# Patient Record
Sex: Male | Born: 1945 | Race: White | Hispanic: No | Marital: Married | Smoking: Former smoker
Health system: Southern US, Community
[De-identification: ages and names within clinical notes are randomized; demographics above are authoritative.]

## PROBLEM LIST (undated history)

## (undated) DIAGNOSIS — I10 Essential (primary) hypertension: Secondary | ICD-10-CM

## (undated) DIAGNOSIS — Z77098 Contact with and (suspected) exposure to other hazardous, chiefly nonmedicinal, chemicals: Secondary | ICD-10-CM

## (undated) DIAGNOSIS — E785 Hyperlipidemia, unspecified: Secondary | ICD-10-CM

## (undated) HISTORY — DX: Contact with and (suspected) exposure to other hazardous, chiefly nonmedicinal, chemicals: Z77.098

## (undated) HISTORY — DX: Essential (primary) hypertension: I10

## (undated) HISTORY — DX: Hyperlipidemia, unspecified: E78.5

---

## 2000-09-12 HISTORY — PX: CHOLECYSTECTOMY: SHX55

## 2001-04-16 ENCOUNTER — Observation Stay (HOSPITAL_COMMUNITY): Admission: RE | Admit: 2001-04-16 | Discharge: 2001-04-17 | Payer: Self-pay | Admitting: Surgery

## 2001-04-16 ENCOUNTER — Encounter: Payer: Self-pay | Admitting: Surgery

## 2001-04-16 ENCOUNTER — Encounter (INDEPENDENT_AMBULATORY_CARE_PROVIDER_SITE_OTHER): Payer: Self-pay | Admitting: Specialist

## 2010-08-23 ENCOUNTER — Ambulatory Visit (HOSPITAL_COMMUNITY)
Admission: RE | Admit: 2010-08-23 | Discharge: 2010-08-23 | Payer: Self-pay | Source: Home / Self Care | Attending: Surgery | Admitting: Surgery

## 2010-11-23 LAB — CBC
HCT: 50.6 % (ref 39.0–52.0)
Hemoglobin: 17 g/dL (ref 13.0–17.0)
MCH: 29.9 pg (ref 26.0–34.0)
MCHC: 33.6 g/dL (ref 30.0–36.0)
MCV: 88.9 fL (ref 78.0–100.0)
Platelets: 260 10*3/uL (ref 150–400)
RBC: 5.69 MIL/uL (ref 4.22–5.81)
RDW: 15.1 % (ref 11.5–15.5)
WBC: 9.4 10*3/uL (ref 4.0–10.5)

## 2010-11-23 LAB — URINE MICROSCOPIC-ADD ON

## 2010-11-23 LAB — DIFFERENTIAL
Basophils Absolute: 0 10*3/uL (ref 0.0–0.1)
Basophils Relative: 0 % (ref 0–1)
Eosinophils Absolute: 0.2 10*3/uL (ref 0.0–0.7)
Eosinophils Relative: 2 % (ref 0–5)
Lymphocytes Relative: 30 % (ref 12–46)
Lymphs Abs: 2.8 10*3/uL (ref 0.7–4.0)
Monocytes Absolute: 0.7 10*3/uL (ref 0.1–1.0)
Monocytes Relative: 7 % (ref 3–12)
Neutro Abs: 5.8 10*3/uL (ref 1.7–7.7)
Neutrophils Relative %: 61 % (ref 43–77)

## 2010-11-23 LAB — URINALYSIS, ROUTINE W REFLEX MICROSCOPIC
Bilirubin Urine: NEGATIVE
Glucose, UA: NEGATIVE mg/dL
Ketones, ur: NEGATIVE mg/dL
pH: 5 (ref 5.0–8.0)

## 2010-11-23 LAB — BASIC METABOLIC PANEL
BUN: 11 mg/dL (ref 6–23)
CO2: 28 mEq/L (ref 19–32)
Calcium: 9.4 mg/dL (ref 8.4–10.5)
Chloride: 105 mEq/L (ref 96–112)
Creatinine, Ser: 0.95 mg/dL (ref 0.4–1.5)
GFR calc Af Amer: >60 mL/min (ref >60)
GFR calc non Af Amer: >60 mL/min (ref >60)
Glucose, Bld: 92 mg/dL (ref 70–99)
Potassium: 4.8 mEq/L (ref 3.5–5.1)
Sodium: 139 mEq/L (ref 135–145)

## 2010-11-23 LAB — SURGICAL PCR SCREEN: MRSA, PCR: NEGATIVE

## 2010-11-23 LAB — PROTIME-INR
INR: 1.05 (ref 0.00–1.49)
Prothrombin Time: 13.9 seconds (ref 11.6–15.2)

## 2011-01-28 NOTE — Op Note (Signed)
[facility]  Patient:    Spencer Ingram, Spencer Ingram                        MRN: [id number] Proc. Date: 04/16/01 Adm. Date:  [id number] Attending:  Abigail Miyamoto A                           Operative Report  PREOPERATIVE DIAGNOSIS:  Symptomatic cholelithiasis.  POSTOPERATIVE DIAGNOSIS:  Symptomatic cholelithiasis.  PROCEDURE:  Laparoscopic cholecystectomy.  SURGEON:  Abigail Miyamoto, M.D.  ASSISTANT:  Gita Kudo, M.D.  ANESTHESIA:  General endotracheal anesthesia.  ESTIMATED BLOOD LOSS:  Minimal.  DESCRIPTION OF PROCEDURE:  The patient was brought to the operating room, identified as [name]. He was placed supine on the operating room table and then general anesthesia was induced. His abdomen was then prepped and draped in the usual sterile fashion. Using a #15 blade, a small vertical incision was made above the umbilicus. The incision was carried down through the fascia with the scalpel. The fascia was then identified and opened with a scalpel. The underlying mesh from a previous umbilical hernia repair was also opened. The Hasson port was then placed through the opening and insufflation of the abdomen was begun. Next, an 11 mm port was placed in the patients epigastrium and two 5 mm ports were placed in the patients right flank all under direct vision. The gallbladder was then identified and grasped and retracted above the liver bed. Dissection was then carried out at the base of the gallbladder. The cystic duct was dissected out, clipped three times proximally, once distally and transected with the scissors. The cystic artery was likewise identified, clipped twice proximally, once distally and transected as well. The gallbladder was slowly dissected free from the liver bed with the electrocautery. Once removed, the gallbladder was placed in an endosac and removed through the incision at the umbilicus. The abdomen was then copiously irrigated  with normal saline. Hemostasis did appear to be achieved. All ports were then removed under direct vision. The fascial defect along with the mesh was then closed with two figure-of-eight #0 Prolene sutures. All skin incisions were then anesthetized with 0.25% Marcaine and closed with 4-0 Vicryl subcuticular sutures. Steri-Strips, gauze, and tape were then applied. The patient tolerated the procedure well. All sponge, needle and instrument counts were correct at the end of the procedure. The patient was then extubated in the operating room and taken in stable condition to the recovery room. DD:  04/16/01 TD:  04/17/01 Job: [id number]

## 2011-10-31 IMAGING — CR DG CHEST 2V
2 series · 2 of 2 positions shown · non-contrast
Comparison: None.

CLINICAL DATA: Incisional hernia.  Preoperative chest radiograph.

CHEST - 2 VIEW

[view not recorded (1 of 2)]
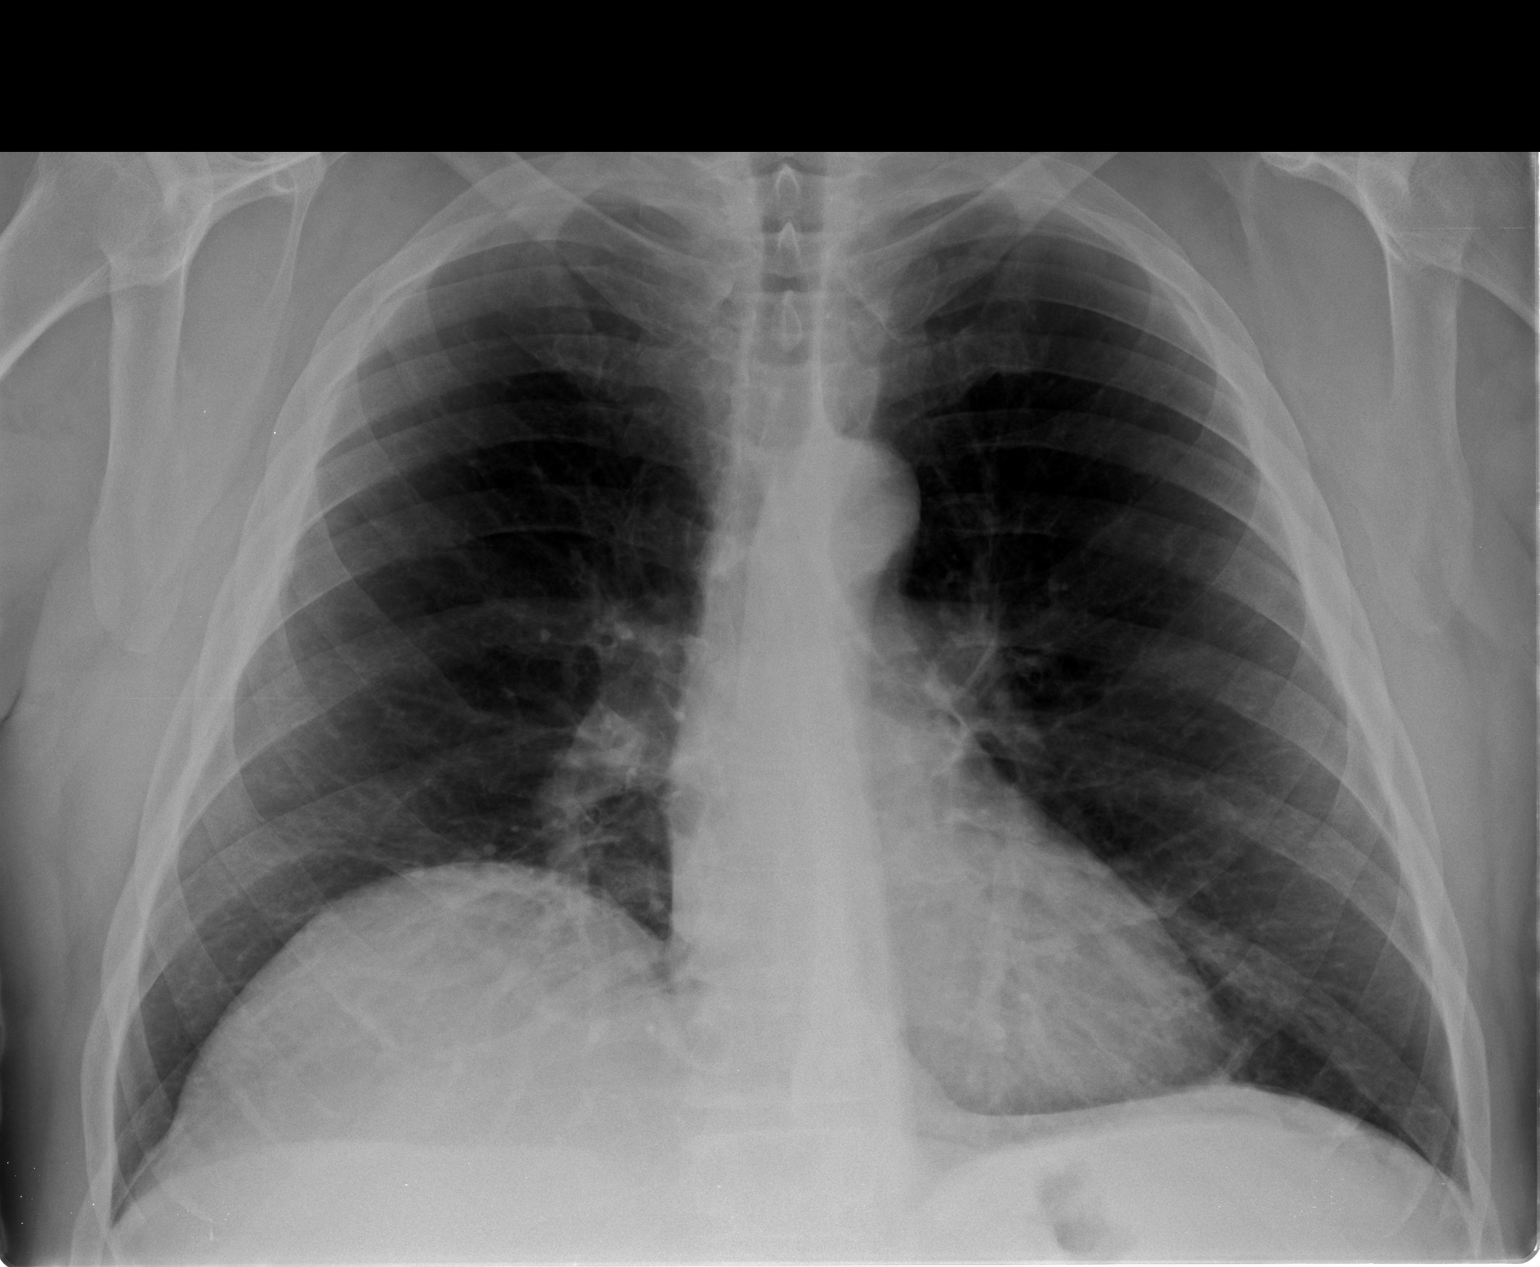

[view not recorded (2 of 2)]
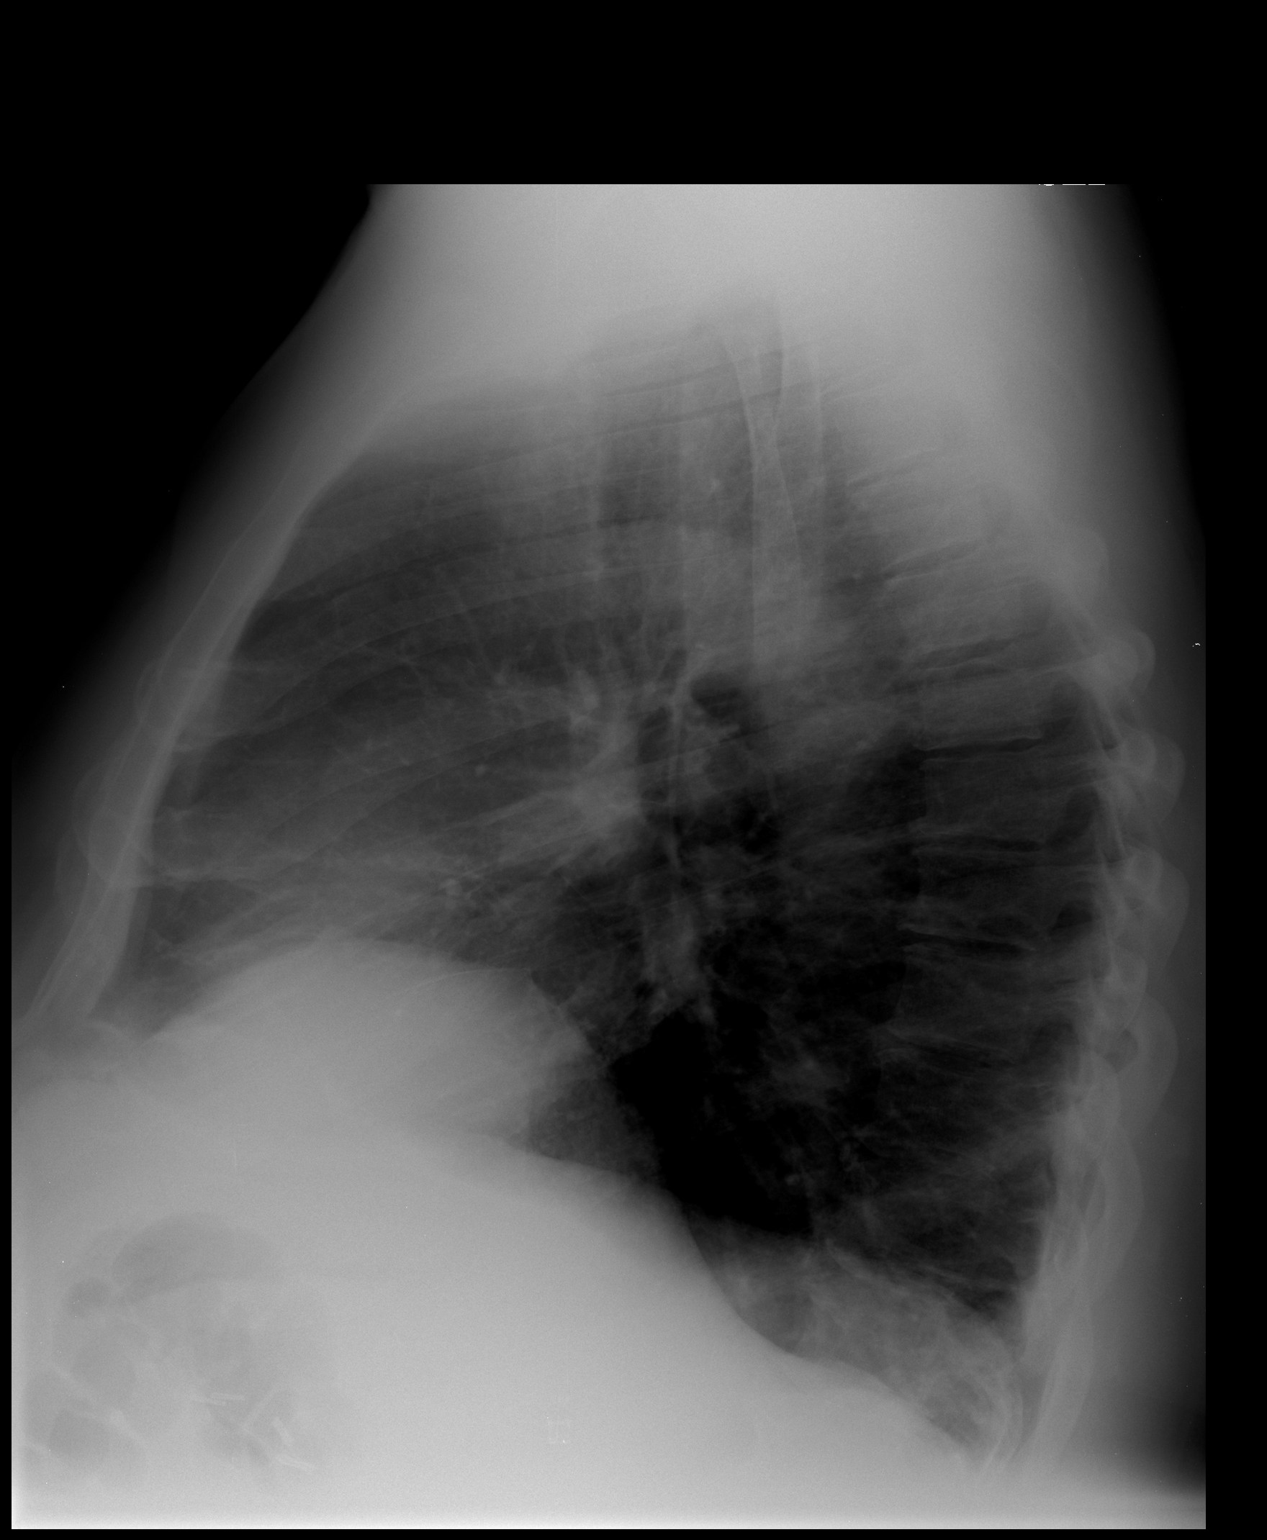

[2 of 2 positions shown; findings below may reference images not displayed]

FINDINGS: Eventration  of the right hemidiaphragm anteriorly.
Cardiopericardial silhouette appears within normal limits.  Trachea
midline.  No airspace disease.  No effusion.  No plain film
evidence of adenopathy.  Hyperinflation.
IMPRESSION: No active cardiopulmonary disease.  Hyperinflation suggesting
emphysema.

## 2013-08-29 ENCOUNTER — Ambulatory Visit (INDEPENDENT_AMBULATORY_CARE_PROVIDER_SITE_OTHER): Payer: Medicare Other | Admitting: Internal Medicine

## 2013-08-29 ENCOUNTER — Encounter: Payer: Self-pay | Admitting: Internal Medicine

## 2013-08-29 VITALS — BP 106/80 | HR 105 | Temp 98.4°F | Ht 69.0 in | Wt 229.0 lb

## 2013-08-29 DIAGNOSIS — F17200 Nicotine dependence, unspecified, uncomplicated: Secondary | ICD-10-CM

## 2013-08-29 DIAGNOSIS — J449 Chronic obstructive pulmonary disease, unspecified: Secondary | ICD-10-CM

## 2013-08-29 MED ORDER — BUDESONIDE-FORMOTEROL FUMARATE 160-4.5 MCG/ACT IN AERO: INHALATION_SPRAY | RESPIRATORY_TRACT | Status: DC

## 2013-08-29 MED ORDER — PANTOPRAZOLE SODIUM 40 MG PO TBEC: 40.0000 mg | DELAYED_RELEASE_TABLET | Freq: Every day | ORAL | Status: AC

## 2013-08-29 MED ORDER — ACLIDINIUM BROMIDE 400 MCG/ACT IN AEPB: 1.0000 | INHALATION_SPRAY | Freq: Two times a day (BID) | RESPIRATORY_TRACT | Status: DC

## 2013-08-29 MED ORDER — FAMOTIDINE 20 MG PO TABS: ORAL_TABLET | ORAL | Status: AC

## 2013-08-29 MED ORDER — PREDNISONE 10 MG PO TABS: ORAL_TABLET | ORAL | Status: DC

## 2013-08-29 NOTE — Patient Instructions (Addendum)
Stop advair and ipatropium   Plan A = Automatic=  Symbicort 160 Take 2 puffs first thing in am and then another 2 puffs about 12 hours later. Chase symbicort with tudorza  Plan B= backup = Albuterol 2 pffs every 4 hours, if you can't catch your breath  Plan C = Backup to plan B = use the nebulizer up to every 4 hours  Plan D = call the doctor  Plan E = ER   Prednisone 20 mg daily  x one week, 10 mg x one week, then 5 mg week - take with breakfast  Pantoprazole (protonix) 40 mg   Take 30-60 min before first meal of the day and Pepcid 20 mg one bedtime until return to office - this is the best way to tell whether stomach acid is contributing to your problem.    GERD (REFLUX)  is an extremely common cause of respiratory symptoms, many times with no significant heartburn at all.    It can be treated with medication, but also with lifestyle changes including avoidance of late meals, excessive alcohol, smoking cessation, and avoid fatty foods, chocolate, peppermint, colas, red wine, and acidic juices such as orange juice.  NO MINT OR MENTHOL PRODUCTS SO NO COUGH DROPS  USE SUGARLESS CANDY INSTEAD (jolley ranchers or Stover's)  NO OIL BASED VITAMINS - use powdered substitutes.    Please schedule a follow up office visit in 3 weeks, sooner if needed

## 2013-08-29 NOTE — Progress Notes (Signed)
   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    08/29/2013 1st [facility] office visit/ Kyria Bumgardner cc doe x "may a  Decade" best days can still entire [employer] leaning on cart s 02  But still needing neb esp in am on best days and on day of ov sob x 50 ft, better on prednisone but "I never get a high enough dose",  Assoc with min cough mostly in am's of scant white mucus and assoc with mod nasal congestion also,  No obvious patterns in day to day or daytime variabilty or assoc  cp or chest tightness, subjective wheeze overt sinus or hb symptoms. No unusual exp hx or h/o childhood pna/ asthma or knowledge of premature birth.  Sleeping ok without nocturnal  or early am exacerbation  of respiratory  c/o's or need for noct saba. Also denies any obvious fluctuation of symptoms with weather or environmental changes or other aggravating or alleviating factors except as outlined above   Current Medications, Allergies, Complete Past Medical History, Past Surgical History, Family History, and Social History were reviewed in [employer] record.  ROS  The following are not active complaints unless bolded sore throat, dysphagia, dental problems, itching, sneezing,  nasal congestion or excess/ purulent secretions, ear ache,   fever, chills, sweats, unintended wt loss, pleuritic or exertional cp, hemoptysis,  orthopnea pnd or leg swelling, presyncope, palpitations, heartburn, abdominal pain, anorexia, nausea, vomiting, diarrhea  or change in bowel or urinary habits, change in stools or urine, dysuria,hematuria,  rash, arthralgias, visual complaints, headache, numbness weakness or ataxia or problems with walking or coordination,  change in mood/affect or memory.             Review of Systems  Constitutional: Positive for appetite change and unexpected weight change. Negative for fever, chills and activity change.  HENT: Positive for congestion and dental problem. Negative for postnasal drip, rhinorrhea, sneezing, sore throat, trouble swallowing and voice change.   Eyes: Negative for visual disturbance.  Respiratory: Positive for shortness of breath. Negative for cough and choking.   Cardiovascular: Negative for chest pain and leg swelling.  Gastrointestinal: Negative for nausea, vomiting and abdominal pain.  Genitourinary: Negative for difficulty urinating.  Musculoskeletal: Negative for arthralgias.  Skin: Negative for rash.  Psychiatric/Behavioral: Negative for behavioral problems and confusion.       Objective:   Physical Exam  amb obese wm with gruff voice and prom pseudowheze  Wt Readings from Last 3 Encounters:  08/29/13 229 lb (103.874 kg)      HEENT mild turbinate edema.  Oropharynx no thrush or excess pnd or cobblestoning.  No JVD or cervical adenopathy. Mild accessory muscle hypertrophy. Trachea midline, nl thryroid. Chest was hyperinflated by percussion with diminished breath sounds and moderate increased exp time without wheeze. Hoover sign positive at mid to late  inspiration. Regular rate and rhythm without murmur gallop or rub or increase P2 or edema.  Abd: no hsm, nl excursion. Ext warm without cyanosis or clubbing.      No cxr on file     Assessment & Plan:

## 2013-08-30 DIAGNOSIS — J449 Chronic obstructive pulmonary disease, unspecified: Secondary | ICD-10-CM | POA: Insufficient documentation

## 2013-08-30 DIAGNOSIS — F17200 Nicotine dependence, unspecified, uncomplicated: Secondary | ICD-10-CM | POA: Insufficient documentation

## 2013-08-30 NOTE — Assessment & Plan Note (Signed)
DDX of  difficult airways managment all start with A and  include Adherence, Ace Inhibitors, Acid Reflux, Active Sinus Disease, Alpha 1 Antitripsin deficiency, Anxiety masquerading as Airways dz,  ABPA,  allergy(esp in young), Aspiration (esp in elderly), Adverse effects of DPI,  Active smokers, plus two Bs  = Bronchiectasis and Beta blocker use..and one C= CHF  Adherence is always the initial "prime suspect" and is a multilayered concern that requires a "trust but verify" approach in every patient - starting with knowing how to use medications, especially inhalers, correctly, keeping up with refills and understanding the fundamental difference between maintenance and prns vs those medications only taken for a very short course and then stopped and not refilled.  - In this case Adherence is the biggest issue and starts with  inability to use HFA effectively and also  understand that SABA treats the symptoms but doesn't get to the underlying problem (inflammation).  I used  the analogy of putting steroid cream on a rash to help explain the meaning of topical therapy and the need to get the drug to the target tissue.   - The proper method of use, as well as anticipated side effects, of a metered-dose inhaler are discussed and demonstrated to the patient. Improved effectiveness after extensive coaching during this visit to a level of approximately  So try symbicort 160 2bid   ? Acid (or non-acid) GERD > always difficult to exclude as up to 75% of pts in some series report no assoc GI/ Heartburn symptoms> rec max (24h)  acid suppression and diet restrictions/ reviewed and instructions given in writing.  ? Adverse effects of dpi, esp high dose advair/spiriva > he's already stopped spiriva, will try off advair and rx tudorza  Active smoking also concern > discussed separately  Anxiety usually a dx of exclusion but recently started on xanax which should help  Plan to f/u with pfts p the first of the year

## 2013-08-30 NOTE — Assessment & Plan Note (Signed)
I took an extended  opportunity with this patient to outline the consequences of continued cigarette use  in airway disorders based on all the data we have from the multiple national lung health studies (perfomed over decades at millions of dollars in cost)  indicating that smoking cessation, not choice of inhalers or physicians, is the most important aspect of care.   

## 2013-08-31 ENCOUNTER — Telehealth: Payer: Self-pay | Admitting: Internal Medicine

## 2013-08-31 NOTE — Telephone Encounter (Signed)
Wife called reporting pt no better Has not used plan C today at all = neb albuterol  rec use neb up to every 4 hours and if can't catch breath at rest then go to ER

## 2013-09-02 ENCOUNTER — Telehealth: Payer: Self-pay | Admitting: Internal Medicine

## 2013-09-02 NOTE — Telephone Encounter (Signed)
See as add on with all meds on hand or see Tammy NP tomorrow same instructions, in meantime continue the neb up to every 3 h and if not better go to ER

## 2013-09-02 NOTE — Telephone Encounter (Signed)
Spoke with spouse. She reports pt is doing the symbicort 2 puffs BID, the albuterol rescue inhaler 2 puffs every 4 hrs and his nebulizer as well. He is still feeling SOB like he can't catch his breath. Pt is not due back for 3 weeks and not sure what else to do. Pt was just seen on 08/29/13. Any further recs MW? Thanks  Allergies  Allergen Reactions  . Asa [Aspirin] Nausea Only  . Simvastatin Swelling

## 2013-09-02 NOTE — Telephone Encounter (Signed)
Pt is aware of MW recs. Has been scheduled for tomorrow at 11:15am.

## 2013-09-03 ENCOUNTER — Encounter: Payer: Self-pay | Admitting: Internal Medicine

## 2013-09-03 ENCOUNTER — Encounter: Payer: Self-pay | Admitting: *Deleted

## 2013-09-03 ENCOUNTER — Ambulatory Visit (INDEPENDENT_AMBULATORY_CARE_PROVIDER_SITE_OTHER): Payer: BC Managed Care – PPO | Admitting: Internal Medicine

## 2013-09-03 ENCOUNTER — Ambulatory Visit (INDEPENDENT_AMBULATORY_CARE_PROVIDER_SITE_OTHER)
Admission: RE | Admit: 2013-09-03 | Discharge: 2013-09-03 | Disposition: A | Discharge status: Home / Self Care | Payer: Medicare Other | Source: Ambulatory Visit | Attending: Internal Medicine | Admitting: Internal Medicine

## 2013-09-03 ENCOUNTER — Other Ambulatory Visit (INDEPENDENT_AMBULATORY_CARE_PROVIDER_SITE_OTHER): Payer: Medicare Other

## 2013-09-03 VITALS — BP 130/90 | HR 111 | Temp 98.0°F | Ht 69.5 in | Wt 223.2 lb

## 2013-09-03 DIAGNOSIS — R0600 Dyspnea, unspecified: Secondary | ICD-10-CM

## 2013-09-03 DIAGNOSIS — R0609 Other forms of dyspnea: Secondary | ICD-10-CM

## 2013-09-03 DIAGNOSIS — J449 Chronic obstructive pulmonary disease, unspecified: Secondary | ICD-10-CM

## 2013-09-03 DIAGNOSIS — F17200 Nicotine dependence, unspecified, uncomplicated: Secondary | ICD-10-CM

## 2013-09-03 LAB — BASIC METABOLIC PANEL
GFR: 59.64 mL/min — ABNORMAL LOW (ref >60.00)
Glucose, Bld: 129 mg/dL — ABNORMAL HIGH (ref 70–99)
Potassium: 4.6 mEq/L (ref 3.5–5.1)
Sodium: 135 mEq/L (ref 135–145)

## 2013-09-03 LAB — CBC WITH DIFFERENTIAL/PLATELET
Basophils Relative: 0.1 % (ref 0.0–3.0)
Eosinophils Absolute: 0 10*3/uL (ref 0.0–0.7)
Eosinophils Relative: 0.1 % (ref 0.0–5.0)
Hemoglobin: 18.1 g/dL (ref 13.0–17.0)
Lymphocytes Relative: 9.7 % — ABNORMAL LOW (ref 12.0–46.0)
MCHC: 33.3 g/dL (ref 30.0–36.0)
Monocytes Relative: 2.9 % — ABNORMAL LOW (ref 3.0–12.0)
Neutro Abs: 11.5 10*3/uL — ABNORMAL HIGH (ref 1.4–7.7)
RBC: 6.39 Mil/uL — ABNORMAL HIGH (ref 4.22–5.81)
WBC: 13.2 10*3/uL — ABNORMAL HIGH (ref 4.5–10.5)

## 2013-09-03 MED ORDER — METHYLPREDNISOLONE ACETATE 80 MG/ML IJ SUSP
120.0000 mg | Freq: Once | INTRAMUSCULAR | Status: AC
  Administered 2013-09-03: 120 mg via INTRAMUSCULAR

## 2013-09-03 MED ORDER — LEVALBUTEROL HCL 0.63 MG/3ML IN NEBU
0.6300 mg | INHALATION_SOLUTION | Freq: Once | RESPIRATORY_TRACT | Status: AC
  Administered 2013-09-03: 0.63 mg via RESPIRATORY_TRACT

## 2013-09-03 MED ORDER — ALPRAZOLAM 0.5 MG PO TABS: 0.5000 mg | ORAL_TABLET | Freq: Four times a day (QID) | ORAL | Status: AC | PRN

## 2013-09-03 NOTE — Progress Notes (Signed)
Quick Note:  Spoke with pt and notified of results per Dr. Wert. Pt verbalized understanding and denied any questions.  ______ 

## 2013-09-03 NOTE — Patient Instructions (Addendum)
The key is to stop smoking completely before smoking completely stops you - this is the most important aspect of your care  For nerves xanax 0.5 mg four times daily as needed and remember your purse lip breathing  Plan A = Automatic=  Symbicort 160 Take 2 puffs first thing in am and then another 2 puffs about 12 hours later. Chase symbicort immediately with tudorza  Plan B= backup = Albuterol 2 pffs every 3  hours, if you can't catch your breath  Plan C = Backup to plan B = use the nebulizer up to every 3 hours  Plan D = call the doctor  Plan E = ER   Prednisone 20 mg daily  x one week, 10 mg x one week, then 5 mg week - take with breakfast  Pantoprazole (protonix) 40 mg   Take 30-60 min before first meal of the day and Pepcid 20 mg one bedtime until return to office - this is the best way to tell whether stomach acid is contributing to your problem.    GERD (REFLUX)  is an extremely common cause of respiratory symptoms, many times with no significant heartburn at all.    It can be treated with medication, but also with lifestyle changes including avoidance of late meals, excessive alcohol, smoking cessation, and avoid fatty foods, chocolate, peppermint, colas, red wine, and acidic juices such as orange juice.  NO MINT OR MENTHOL PRODUCTS SO NO COUGH DROPS  USE SUGARLESS CANDY INSTEAD (jolley ranchers or Stover's)  NO OIL BASED VITAMINS - use powdered substitutes.    Please remember to go to the lab and x-ray department downstairs for your tests - we will call you with the results when they are available.  Keep previous appt

## 2013-09-03 NOTE — Progress Notes (Addendum)
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   History of Present Illness  08/29/2013 1st [facility] office visit/ Julieth Tugman still smoking  cc doe x "maybe a  Decade" best days can still entire [employer] leaning on cart s 02  But still needing neb esp in am on best days and on day of ov sob x 50 ft, better on prednisone but "I never get a high enough dose",  Assoc with min cough mostly in am's of scant white mucus and assoc with mod nasal congestion also.  rec Stop advair and ipatropium  Plan A = Automatic=  Symbicort 160 Take 2 puffs first thing in am and then another 2 puffs about 12 hours later. Chase symbicort with tudorza Plan B= backup = Albuterol 2 pffs every 4 hours, if you can't catch your breath Plan C = Backup to plan B = use the nebulizer up to every 4 hours Plan D = call the doctor Plan E = ER  Prednisone 20 mg daily  x one week, 10 mg x one week, then 5 mg week - take with breakfast Pantoprazole (protonix) 40 mg   Take 30-60 min before first meal of the day and Pepcid 20 mg one bedtime until return to office - this is the best way to tell whether stomach acid is contributing to your problem.   GERD diet/ no smoking  09/03/2013 acute ov/Malyia Moro re: refractory ab Chief Complaint  Patient presents with  . Follow-up    SOB and tightness in chest increasingly worse over the past week  No purulent sputum, better p neb to point where no sob at rest   No obvious patterns in day to day or daytime variabilty or assoc  cp or chest tightness, subjective wheeze overt sinus or hb symptoms. No unusual exp hx or h/o childhood pna/ asthma or knowledge of premature birth.  Sleeping ok without nocturnal  or early am exacerbation  of respiratory  c/o's or  need for noct saba. Also denies any obvious fluctuation of symptoms with weather or environmental changes or other aggravating or alleviating factors except as outlined above   Current Medications, Allergies, Complete Past Medical History, Past Surgical History, Family History, and Social History were reviewed in [employer] record. ROS  The following are not active complaints unless bolded sore throat, dysphagia, dental problems, itching, sneezing,  nasal congestion or excess/ purulent secretions, ear ache,   fever, chills, sweats, unintended wt loss, pleuritic or exertional cp, hemoptysis,  orthopnea pnd or leg swelling, presyncope, palpitations, heartburn, abdominal pain, anorexia, nausea, vomiting, diarrhea  or change in bowel or urinary habits, change in stools or urine, dysuria,hematuria,  rash, arthralgias, visual complaints, headache, numbness weakness or ataxia or problems with walking or coordination,  change in mood/affect or memory.                  Objective:   Physical Exam  amb obese wm with gruff voice and  prom pseudowheze improves with purse lip breathing   Wt Readings from Last 3 Encounters:  09/03/13 223 lb 3.2 oz (101.243 kg)  08/29/13 229 lb (103.874 kg)       HEENT mild turbinate edema.  Oropharynx no thrush or excess pnd or cobblestoning.  No JVD or cervical adenopathy. Mild accessory muscle hypertrophy. Trachea midline, nl thryroid. Chest was hyperinflated by percussion with diminished breath sounds and moderate increased exp time with mid exp bilateral sonorous rhonchi and wheezes. Hoover sign positive at mid to late  inspiration. Regular rate and rhythm without murmur gallop or rub or increase P2 or edema.  Abd: no hsm, nl excursion. Ext warm without cyanosis or clubbing.      CXR  09/03/2013 :  Cardiac silhouette is normal limits. There is chronic elevation of the right hemidiaphragm. Flattening of the left hemidiaphragm  is appreciated. There is hyperinflation within the lungs. No focal regions of consolidation or focal infiltrates. The osseous structures unremarkable.  IMPRESSION: COPD without evidence of acute cardiopulmonary disease.    labs ok 09/03/13 except hgb 18,, BNP 14     Assessment & Plan:

## 2013-09-04 ENCOUNTER — Encounter: Payer: Self-pay | Admitting: Internal Medicine

## 2013-09-04 LAB — ALLERGY PROFILE REGION II-DC, DE, MD, ~~LOC~~, VA
Allergen, D pternoyssinus,d7: <0.1 kU/L
Alternaria Alternata: <0.1 kU/L
Box Elder IgE: <0.1 kU/L
Cladosporium Herbarum: <0.1 kU/L
Cockroach: <0.1 kU/L
Common Ragweed: <0.1 kU/L

## 2013-09-04 NOTE — Assessment & Plan Note (Signed)

## 2013-09-04 NOTE — Assessment & Plan Note (Addendum)
DDX of  difficult airways managment all start with A and  include Adherence, Ace Inhibitors, Acid Reflux, Active Sinus Disease, Alpha 1 Antitripsin deficiency, Anxiety masquerading as Airways dz,  ABPA,  allergy(esp in young), Aspiration (esp in elderly), Adverse effects of DPI,  Active smokers, plus two Bs  = Bronchiectasis and Beta blocker use..and one C= CHF  Adherence is always the initial "prime suspect" and is a multilayered concern that requires a "trust but verify" approach in every patient - starting with knowing how to use medications, especially inhalers, correctly, keeping up with refills and understanding the fundamental difference between maintenance and prns vs those medications only taken for a very short course and then stopped and not refilled.  .- The proper method of use, as well as anticipated side effects, of a metered-dose inhaler are discussed and demonstrated to the patient. Improved effectiveness after extensive coaching during this visit to a level of approximately  90%  ? Acid (or non-acid) GERD > always difficult to exclude as up to 75% of pts in some series report no assoc GI/ Heartburn symptoms> rec cont max (24h)  acid suppression and diet restrictions/ reviewed and instructions given in writing.   Active smoking> discussed separately   Anxiety > continue prn xanax 0.5 mg 4x daily   Allergies > doubt strongly but will do allergy profile to be complete   ? CHF > excluded with BNP <<100

## 2013-09-04 NOTE — Assessment & Plan Note (Signed)
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

## 2013-09-19 ENCOUNTER — Ambulatory Visit (INDEPENDENT_AMBULATORY_CARE_PROVIDER_SITE_OTHER): Payer: Medicare Other | Admitting: Internal Medicine

## 2013-09-19 ENCOUNTER — Encounter: Payer: Self-pay | Admitting: Internal Medicine

## 2013-09-19 VITALS — BP 150/84 | HR 86 | Temp 98.0°F | Ht 68.5 in | Wt 228.0 lb

## 2013-09-19 DIAGNOSIS — J9610 Chronic respiratory failure, unspecified whether with hypoxia or hypercapnia: Secondary | ICD-10-CM

## 2013-09-19 DIAGNOSIS — J449 Chronic obstructive pulmonary disease, unspecified: Secondary | ICD-10-CM

## 2013-09-19 MED ORDER — PREDNISONE 10 MG PO TABS: ORAL_TABLET | ORAL | Status: DC

## 2013-09-19 MED ORDER — ALBUTEROL SULFATE HFA 108 (90 BASE) MCG/ACT IN AERS: 2.0000 | INHALATION_SPRAY | Freq: Four times a day (QID) | RESPIRATORY_TRACT | Status: AC | PRN

## 2013-09-19 MED ORDER — ACLIDINIUM BROMIDE 400 MCG/ACT IN AEPB: 1.0000 | INHALATION_SPRAY | Freq: Two times a day (BID) | RESPIRATORY_TRACT | Status: DC

## 2013-09-19 MED ORDER — BUDESONIDE-FORMOTEROL FUMARATE 160-4.5 MCG/ACT IN AERO: INHALATION_SPRAY | RESPIRATORY_TRACT | Status: DC

## 2013-09-19 NOTE — Patient Instructions (Addendum)
In the event of  A flare of breathing difficulty > Prednisone 10 mg take  4 each am x 2 days,   2 each am x 2 days,  1 each am x 2 days and stop  Be sure to take the pepcid 20 mg at bedtime regardless of what time it is  Only use your albuterol (ventolin/albuterol/proair) as a rescue medication to be used if you can't catch your breath by resting or doing a relaxed purse lip breathing pattern.  - The less you use it, the better it will work when you need it. - Ok to use up to 2 puffs every 4 hours if you must but call for immediate appointment if use goes up over your usual need - Don't leave home without it !!  (think of it like your spare tire for your car)  - ok to use albuterol neb if the inhaler fails to relieve the breathing  Please schedule a follow up office visit in 4 weeks, sooner if needed with pfts

## 2013-09-19 NOTE — Progress Notes (Signed)
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  rec Stop advair and ipatropium  Plan A = Automatic=  Symbicort 160 Take 2 puffs first thing in am and then another 2 puffs about 12 hours later. Chase symbicort with tudorza Plan B= backup = Albuterol 2 pffs every 4 hours, if you can't catch your breath Plan C = Backup to plan B = use the nebulizer up to every 4 hours Plan D = call the doctor Plan E = ER  Prednisone 20 mg daily  x one week, 10 mg x one week, then 5 mg week - take with breakfast Pantoprazole (protonix) 40 mg   Take 30-60 min before first meal of the day and Pepcid 20 mg one bedtime until return to office - this is the best way to tell whether stomach acid is contributing to your problem.   GERD diet/ no smoking  09/03/2013 acute ov/Princella Jaskiewicz re: refractory ab Chief Complaint  Patient presents with  . Follow-up    SOB and tightness in chest increasingly worse over the past week  No purulent sputum, better p neb to point where no sob at rest  rec The key is to stop smoking completely before smoking completely stops you - this is the most important aspect of your care For nerves xanax 0.5 mg four times daily as needed and remember your purse lip breathing Plan A = Automatic=  Symbicort 160 Take 2 puffs first  thing in am and then another 2 puffs about 12 hours later. Chase symbicort immediately with tudorza Plan B= backup = Albuterol 2 pffs every 3  hours, if you can't catch your breath Plan C = Backup to plan B = use the nebulizer up to every 3 hours Prednisone 20 mg daily  x one week, 10 mg x one week, then 5 mg week - take with breakfast Pantoprazole (protonix) 40 mg   Take 30-60 min before first meal of the day and Pepcid 20 mg one bedtime until return to office - this is the best way to tell whether stomach acid is contributing to your problem.   GERD (REFLUX) diet    09/19/2013 f/u ov/Tawna Alwin re: copd/ab  Overall much better, down to 5 mg pred per day  Chief Complaint  Patient presents with  . Follow-up    Breathing is much improved, but not quite back to his normal baseline. He has had very minimal wheezing. He is using ventolin twice per day.    feels needs ventolin before symbicort in am    No obvious patterns in day to day or daytime variabilty or assoc cough or cp or chest tightness, subjective wheeze overt sinus or hb symptoms.  No unusual exp hx or h/o childhood pna/ asthma or knowledge of premature birth.  Sleeping ok without nocturnal  or early am exacerbation  of respiratory  c/o's or need for noct saba. Also denies any obvious fluctuation of symptoms with weather or environmental changes or other aggravating or alleviating factors except as outlined above   Current Medications, Allergies, Complete Past Medical History, Past Surgical History, Family History, and Social History were reviewed in [facility] electronic medical record. ROS  The following are not active complaints unless bolded sore throat, dysphagia, dental problems, itching, sneezing,  nasal congestion or excess/ purulent secretions, ear ache,   fever, chills, sweats, unintended wt loss, pleuritic or exertional cp, hemoptysis,  orthopnea pnd or leg swelling, presyncope, palpitations, heartburn, abdominal pain, anorexia,  nausea, vomiting, diarrhea  or change in bowel or urinary habits, change in stools or urine, dysuria,hematuria,  rash, arthralgias, visual complaints, headache, numbness weakness or ataxia or problems with walking or coordination,  change in mood/affect or memory.                  Objective:   Physical Exam  amb obese wm with gruff voice and  prom pseudowheze improves with purse lip breathing   Wt Readings from Last 3 Encounters:  09/19/13 228 lb (103.42 kg)  09/03/13 223 lb 3.2 oz (101.243 kg)  08/29/13 229 lb (103.874 kg)          HEENT mild turbinate edema.  Oropharynx no thrush or excess pnd or cobblestoning.  No JVD or cervical adenopathy. Mild accessory muscle hypertrophy. Trachea midline, nl thryroid. Chest was hyperinflated by percussion with diminished breath sounds and moderate increased exp time with mid exp bilateral sonorous rhonchi and wheezes. Hoover sign positive at mid to late  inspiration. Regular rate and rhythm without murmur gallop or rub or increase P2 or edema.  Abd: no hsm, nl excursion. Ext warm without cyanosis or clubbing.      CXR  09/03/2013 :  Cardiac silhouette is normal limits. There is chronic elevation of the right hemidiaphragm. Flattening of the left hemidiaphragm is appreciated. There is hyperinflation within the lungs. No focal regions of consolidation or focal infiltrates. The osseous structures unremarkable.  IMPRESSION: COPD without evidence of acute cardiopulmonary disease.    labs ok 09/03/13 except hgb 18,, BNP 14     Assessment & Plan:

## 2013-09-19 NOTE — Assessment & Plan Note (Addendum)
-.  09/03/2013 p extensive coaching HFA effectiveness =   90% from baseline 50% - allergy profile 09/03/13 > IgE 14.5   Now that has stopped all smoking > Adequate control on present rx, reviewed > no change in rx needed    Reviewed how to use saba effectively  The proper method of use, as well as anticipated side effects, of a metered-dose inhaler are discussed and demonstrated to the patient. Improved effectiveness after extensive coaching during this visit to a level of approximately  90%

## 2013-09-23 ENCOUNTER — Telehealth: Payer: Self-pay | Admitting: Internal Medicine

## 2013-09-23 NOTE — Telephone Encounter (Signed)
Per Ashtyn - these rx's have been [facility]. Advised the pt's wife that we do not have samples of Symbicort at this time. She asks that we write the pt's name down and if some samples come in to call them. [facility].

## 2013-09-25 ENCOUNTER — Telehealth: Payer: Self-pay | Admitting: Internal Medicine

## 2013-09-25 MED ORDER — ACLIDINIUM BROMIDE 400 MCG/ACT IN AEPB: 1.0000 | INHALATION_SPRAY | Freq: Two times a day (BID) | RESPIRATORY_TRACT | Status: AC

## 2013-09-25 MED ORDER — BUDESONIDE-FORMOTEROL FUMARATE 160-4.5 MCG/ACT IN AERO: INHALATION_SPRAY | RESPIRATORY_TRACT | Status: AC

## 2013-09-25 NOTE — Telephone Encounter (Signed)
Called and spoke with pts wife and she stated that the pts meds were not received by the [facility] mail order that the pt uses.  She requested that these be sent back to the pharmacy.  This has been done and pts wife is aware.

## 2013-10-21 ENCOUNTER — Other Ambulatory Visit: Payer: Self-pay | Admitting: Internal Medicine

## 2013-10-21 DIAGNOSIS — J449 Chronic obstructive pulmonary disease, unspecified: Secondary | ICD-10-CM

## 2013-10-22 ENCOUNTER — Ambulatory Visit (INDEPENDENT_AMBULATORY_CARE_PROVIDER_SITE_OTHER): Payer: BC Managed Care – PPO | Admitting: Internal Medicine

## 2013-10-22 ENCOUNTER — Encounter: Payer: Self-pay | Admitting: Internal Medicine

## 2013-10-22 VITALS — BP 116/76 | HR 98 | Temp 97.9°F | Ht 69.0 in | Wt 222.0 lb

## 2013-10-22 DIAGNOSIS — J449 Chronic obstructive pulmonary disease, unspecified: Secondary | ICD-10-CM

## 2013-10-22 DIAGNOSIS — F17200 Nicotine dependence, unspecified, uncomplicated: Secondary | ICD-10-CM

## 2013-10-22 LAB — PULMONARY FUNCTION TEST
DL/VA % PRED: 75 %
DL/VA: 3.45 ml/min/mmHg/L
DLCO unc % pred: 66 %
DLCO unc: 20.53 ml/min/mmHg
FEF 25-75 Post: 0.92 L/sec
FEF 25-75 Pre: 0.52 L/sec
FEF2575-%CHANGE-POST: 75 %
FEF2575-%PRED-PRE: 20 %
FEF2575-%Pred-Post: 36 %
FEV1-%CHANGE-POST: 28 %
FEV1-%PRED-POST: 52 %
FEV1-%PRED-PRE: 40 %
FEV1-POST: 1.69 L
FEV1-Pre: 1.31 L
FEV1FVC-%CHANGE-POST: 9 %
FEV1FVC-%Pred-Pre: 62 %
FEV6-%Change-Post: 18 %
FEV6-%Pred-Post: 76 %
FEV6-%Pred-Pre: 64 %
FEV6-PRE: 2.68 L
FEV6-Post: 3.17 L
FEV6FVC-%Change-Post: 0 %
FEV6FVC-%PRED-PRE: 99 %
FEV6FVC-%Pred-Post: 100 %
FVC-%Change-Post: 17 %
FVC-%Pred-Post: 76 %
FVC-%Pred-Pre: 64 %
FVC-PRE: 2.83 L
FVC-Post: 3.33 L
POST FEV1/FVC RATIO: 51 %
POST FEV6/FVC RATIO: 95 %
PRE FEV6/FVC RATIO: 94 %
Pre FEV1/FVC ratio: 46 %
RV % PRED: 191 %
RV: 4.46 L
TLC % PRED: 119 %
TLC: 8.15 L

## 2013-10-22 NOTE — Progress Notes (Signed)
PFT done today. 

## 2013-10-22 NOTE — Patient Instructions (Addendum)
Try to build up to 30 min of exercise a day but pace yourself  Call me if you want me to refer you to rehab   If you are satisfied with your treatment plan let your doctor know and he/she can either refill your medications or you can return here when your prescription runs out.     If in any way you are not 100% satisfied,  please tell us.  If 100% better, tell your friends!

## 2013-10-22 NOTE — Progress Notes (Signed)
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  rec Stop advair and ipatropium  Plan A = Automatic=  Symbicort 160 Take 2 puffs first thing in am and then another 2 puffs about 12 hours later. Chase symbicort with tudorza Plan B= backup = Albuterol 2 pffs every 4 hours, if you can't catch your breath Plan C = Backup to plan B = use the nebulizer up to every 4 hours Plan D = call the doctor Plan E = ER  Prednisone 20 mg daily  x one week, 10 mg x one week, then 5 mg week - take with breakfast Pantoprazole (protonix) 40 mg   Take 30-60 min before first meal of the day and Pepcid 20 mg one bedtime until return to office - this is the best way to tell whether stomach acid is contributing to your problem.   GERD diet/ no smoking  09/03/2013 acute ov/Wert re: refractory ab Chief Complaint  Patient presents with  . Follow-up    SOB and tightness in chest increasingly worse over the past week  No purulent sputum, better p neb to point where no sob at rest  rec The key is to stop smoking completely before smoking completely stops you - this is the most important aspect of your care For nerves xanax 0.5 mg four times daily as needed and remember your purse lip breathing Plan A = Automatic=   Symbicort 160 Take 2 puffs first thing in am and then another 2 puffs about 12 hours later. Chase symbicort immediately with tudorza Plan B= backup = Albuterol 2 pffs every 3  hours, if you can't catch your breath Plan C = Backup to plan B = use the nebulizer up to every 3 hours Prednisone 20 mg daily  x one week, 10 mg x one week, then 5 mg week - take with breakfast Pantoprazole (protonix) 40 mg   Take 30-60 min before first meal of the day and Pepcid 20 mg one bedtime until return to office - this is the best way to tell whether stomach acid is contributing to your problem.   GERD (REFLUX) diet    09/19/2013 f/u ov/Wert re: copd/ab  Overall much better, down to 5 mg pred per day  Chief Complaint  Patient presents with  . Follow-up    Breathing is much improved, but not quite back to his normal baseline. He has had very minimal wheezing. He is using ventolin twice per day.    feels needs ventolin before symbicort in am  rec In the event of  A flare of breathing difficulty > Prednisone 10 mg take  4 each am  x 2 days,   2 each am x 2 days,  1 each am x 2 days and stop Be sure to take the pepcid 20 mg at bedtime regardless of what time it is Only use your albuterol (ventolin/albuterol/proair)   10/22/2013 f/u ov/Wert re: COPD GOLD II / still smoking / maint on tudorza/symbicort/ did not use pred Chief Complaint  Patient presents with  . Follow-up    Review PFT.  Pt has no breathing complaints at this time.   doe improved to x [employer] no pred no 02 no neb need   No obvious patterns in day to day or daytime variabilty or assoc cough or cp or chest tightness, subjective wheeze overt sinus or hb symptoms. No unusual exp hx or h/o childhood pna/ asthma or knowledge of premature birth.  Sleeping ok without nocturnal  or early am exacerbation  of respiratory  c/o's or need for noct saba. Also denies any obvious fluctuation of symptoms with weather or environmental changes or other  aggravating or alleviating factors except as outlined above   Current Medications, Allergies, Complete Past Medical History, Past Surgical History, Family History, and Social History were reviewed in [facility] electronic medical record. ROS  The following are not active complaints unless bolded sore throat, dysphagia, dental problems, itching, sneezing,  nasal congestion or excess/ purulent secretions, ear ache,   fever, chills, sweats, unintended wt loss, pleuritic or exertional cp, hemoptysis,  orthopnea pnd or leg swelling, presyncope, palpitations, heartburn, abdominal pain, anorexia, nausea, vomiting, diarrhea  or change in bowel or urinary habits, change in stools or urine, dysuria,hematuria,  rash, arthralgias, visual complaints, headache, numbness weakness or ataxia or problems with walking or coordination,  change in mood/affect or memory.                  Objective:   Physical Exam  amb obese wm with  Mildly gruff voice     10/22/2013        222 Wt Readings from Last 3 Encounters:  09/19/13 228 lb (103.42 kg)  09/03/13 223 lb 3.2 oz (101.243 kg)  08/29/13 229 lb (103.874 kg)          HEENT mild turbinate edema.  Oropharynx no thrush or excess pnd or cobblestoning.  No JVD or cervical adenopathy. Mild accessory muscle hypertrophy. Trachea midline, nl thryroid. Chest was hyperinflated by percussion with diminished breath sounds and moderate increased exp time with mid exp bilateral sonorous rhonchi and wheezes. Hoover sign positive at mid to late  inspiration. Regular rate and rhythm without murmur gallop or rub or increase P2 or edema.  Abd: no hsm, nl excursion. Ext warm without cyanosis or clubbing.      CXR  09/03/2013 :  Cardiac silhouette is normal limits. There is chronic elevation of the right hemidiaphragm. Flattening of the left hemidiaphragm is appreciated. There is hyperinflation within the lungs. No focal regions of consolidation or focal infiltrates.  The osseous structures unremarkable.  IMPRESSION: COPD without evidence of acute cardiopulmonary disease.    labs ok 09/03/13 except hgb 18,, BNP 14     Assessment & Plan:

## 2013-10-22 NOTE — Assessment & Plan Note (Addendum)
-   allergy profile 09/03/13 > IgE 14.5  - 09/19/2013 p extensive coaching HFA effectiveness =    90% - PFT's 10/22/2013   FEV1  1.69 (52%) ratio 51 which is 28% improvement p B2 and dlco 66 and corrects 75%  Marked improvement albeit on max rx and still smoking (discussed separately)    Each maintenance medication was reviewed in detail including most importantly the difference between maintenance and as needed and under what circumstances the prns are to be used.  Please see instructions for details which were reviewed in writing and the patient given a copy.    Pulmonary f/u can be prn

## 2013-10-23 NOTE — Assessment & Plan Note (Signed)
>   3 mi  I took an extended  opportunity with this patient to outline the consequences of continued cigarette use  in airway disorders based on all the data we have from the multiple national lung health studies (perfomed over decades at millions of dollars in cost)  indicating that smoking cessation, not choice of inhalers or physicians, is the most important aspect of care.   

## 2014-01-28 ENCOUNTER — Telehealth: Payer: Self-pay | Admitting: Internal Medicine

## 2014-01-28 NOTE — Telephone Encounter (Signed)
Made in error

## 2014-02-10 DEATH — deceased

## 2014-11-16 IMAGING — CR DG CHEST 2V
2 series · 2 of 2 positions shown · non-contrast
Comparison: 08/18/2010

CLINICAL DATA: COPD, cough, history of tobacco use

EXAM:
CHEST  2 VIEW

[view not recorded (1 of 2)]
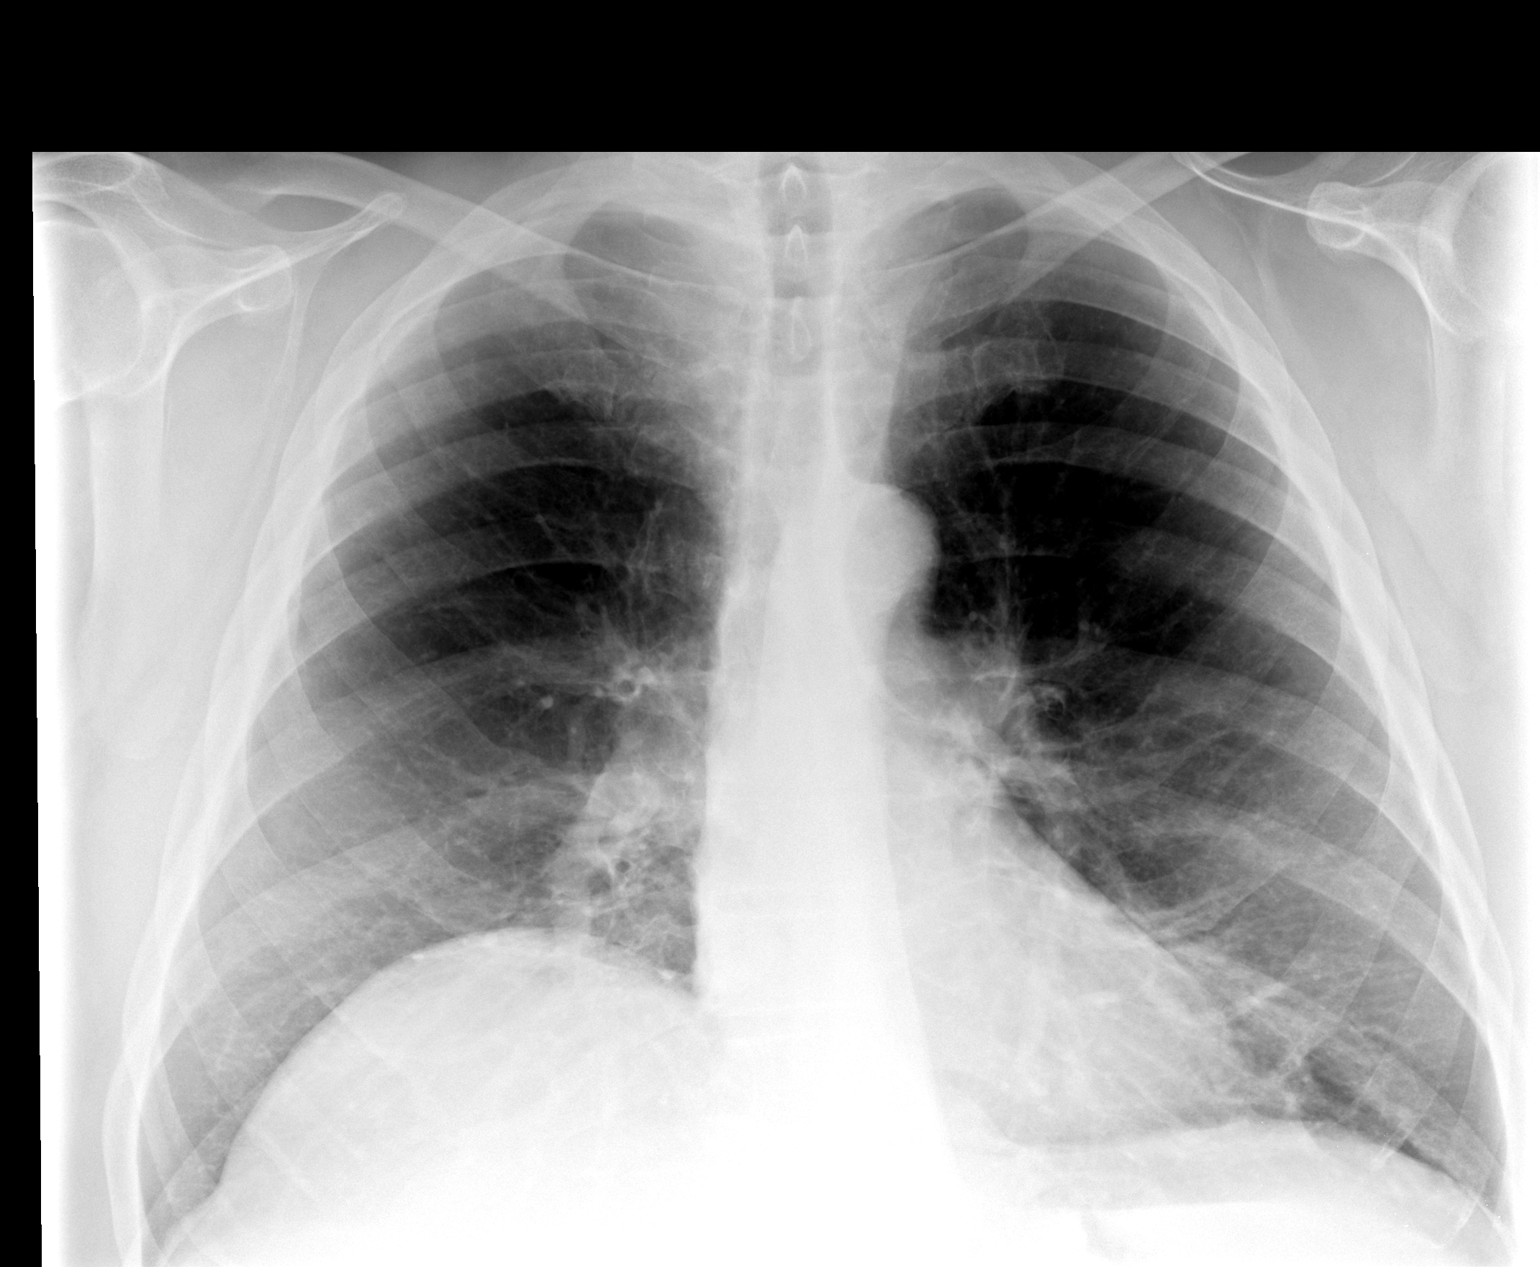

[view not recorded (2 of 2)]
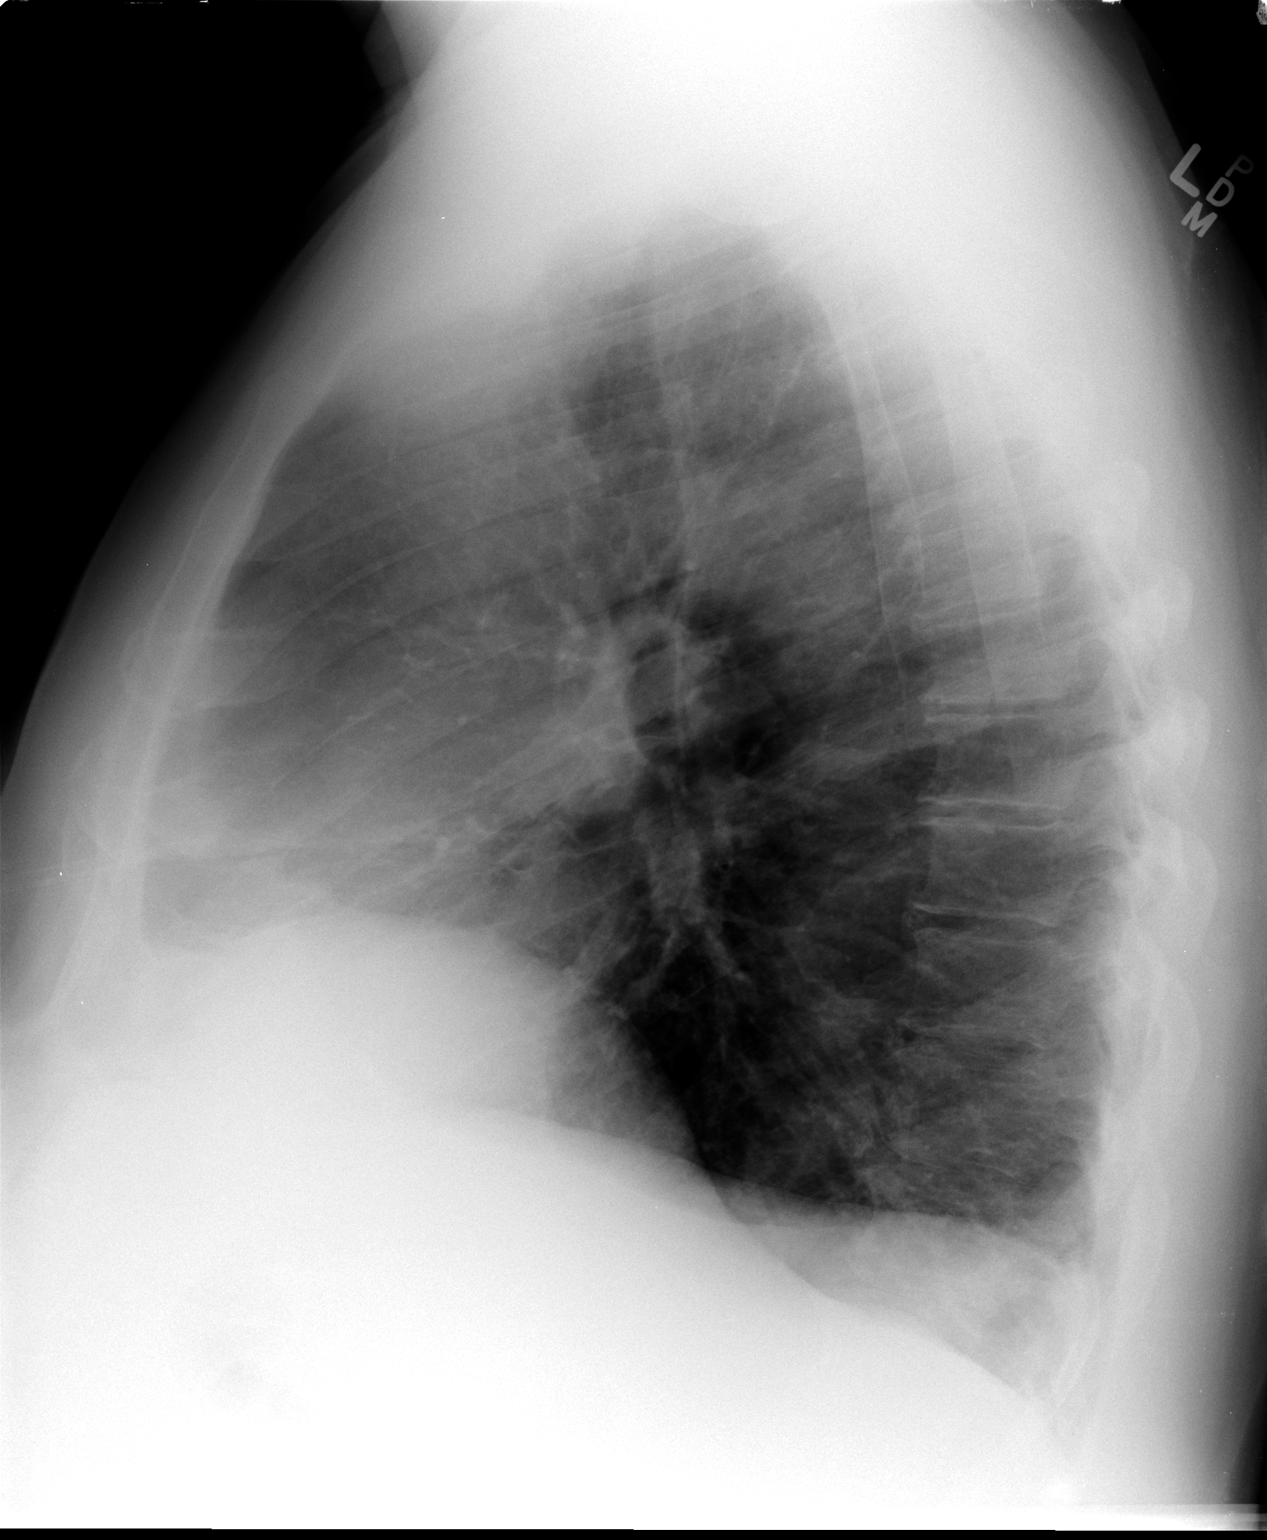

[2 of 2 positions shown; findings below may reference images not displayed]

FINDINGS: Cardiac silhouette is normal limits. There is chronic elevation of
the right hemidiaphragm. Flattening of the left hemidiaphragm is
appreciated. There is hyperinflation within the lungs. No focal
regions of consolidation or focal infiltrates. The osseous
structures unremarkable.
IMPRESSION: COPD without evidence of acute cardiopulmonary disease.
